# Patient Record
Sex: Male | Born: 1995 | Race: White | Hispanic: No | Marital: Single | Smoking: Never smoker
Health system: Southern US, Community
[De-identification: ages and names within clinical notes are randomized; demographics above are authoritative.]

---
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
## 2016-08-09 ENCOUNTER — Emergency Department: Payer: Medicaid Other

## 2016-08-09 ENCOUNTER — Emergency Department
Admission: EM | Admit: 2016-08-09 | Discharge: 2016-08-10 | Disposition: A | Discharge status: Home / Self Care | Payer: Medicaid Other | Attending: Emergency Medicine | Admitting: Emergency Medicine

## 2016-08-09 ENCOUNTER — Encounter: Payer: Self-pay | Admitting: Emergency Medicine

## 2016-08-09 DIAGNOSIS — J40 Bronchitis, not specified as acute or chronic: Secondary | ICD-10-CM

## 2016-08-09 DIAGNOSIS — R0602 Shortness of breath: Secondary | ICD-10-CM | POA: Diagnosis present

## 2016-08-09 LAB — COMPREHENSIVE METABOLIC PANEL
ALT: 31 U/L (ref 17–63)
AST: 23 U/L (ref 15–41)
Albumin: 4.6 g/dL (ref 3.5–5.0)
Alkaline Phosphatase: 86 U/L (ref 38–126)
Anion gap: 9 (ref 5–15)
BILIRUBIN TOTAL: 1 mg/dL (ref 0.3–1.2)
BUN: 17 mg/dL (ref 6–20)
CHLORIDE: 99 mmol/L — AB (ref 101–111)
CO2: 27 mmol/L (ref 22–32)
CREATININE: 1.19 mg/dL (ref 0.61–1.24)
Calcium: 9.6 mg/dL (ref 8.9–10.3)
Glucose, Bld: 92 mg/dL (ref 65–99)
Potassium: 4.2 mmol/L (ref 3.5–5.1)
Sodium: 135 mmol/L (ref 135–145)
TOTAL PROTEIN: 8.2 g/dL — AB (ref 6.5–8.1)

## 2016-08-09 LAB — TROPONIN I

## 2016-08-09 LAB — CBC WITH DIFFERENTIAL/PLATELET
BASOS ABS: 0.1 10*3/uL (ref 0–0.1)
Basophils Relative: 1 %
EOS PCT: 1 %
Eosinophils Absolute: 0.1 10*3/uL (ref 0–0.7)
HEMATOCRIT: 46.7 % (ref 40.0–52.0)
Hemoglobin: 16.1 g/dL (ref 13.0–18.0)
LYMPHS ABS: 2.8 10*3/uL (ref 1.0–3.6)
Lymphocytes Relative: 21 %
MCH: 29.6 pg (ref 26.0–34.0)
MCHC: 34.5 g/dL (ref 32.0–36.0)
MCV: 86 fL (ref 80.0–100.0)
MONO ABS: 1 10*3/uL (ref 0.2–1.0)
Monocytes Relative: 8 %
NEUTROS ABS: 9.4 10*3/uL — AB (ref 1.4–6.5)
NEUTROS PCT: 69 %
PLATELETS: 251 10*3/uL (ref 150–440)
RBC: 5.43 MIL/uL (ref 4.40–5.90)
RDW: 12.6 % (ref 11.5–14.5)
WBC: 13.5 10*3/uL — AB (ref 3.8–10.6)

## 2016-08-09 NOTE — ED Notes (Signed)
Pt ambulatory to desk with noted SOB, reports feels like he can't get more than half a breath in.  Pt's o2sat 100%, HR 110, pt seated in wheelchair, HR decreased to 90s.

## 2016-08-09 NOTE — ED Triage Notes (Signed)
Pt states that x 2 days he has been having some dizziness when taking a full breath. Pt reports that pain wraps around to the back when he breaths heavily. Pt also reports cold symptoms. Pt is in NAD at this time and has clear lung sounds upon auscultation in all fields.

## 2016-08-10 MED ORDER — AZITHROMYCIN 500 MG PO TABS
500.0000 mg | ORAL_TABLET | Freq: Once | ORAL | Status: AC
  Administered 2016-08-10: 500 mg via ORAL
  Filled 2016-08-10: qty 1

## 2016-08-10 MED ORDER — HYDROCOD POLST-CPM POLST ER 10-8 MG/5ML PO SUER: 5.0000 mL | Freq: Two times a day (BID) | ORAL | 0 refills | Status: AC | PRN

## 2016-08-10 MED ORDER — AZITHROMYCIN 250 MG PO TABS: ORAL_TABLET | ORAL | 0 refills | Status: AC

## 2016-08-10 NOTE — ED Notes (Signed)
Pt stating s/x's have been ongoing for the last 2-3 days. Pt also asking about a work note. Pt is A&O; skin is WPD, RR even, regular and unlabored, lung sounds clear in all fields.

## 2016-08-10 NOTE — ED Provider Notes (Signed)
[facility] Emergency Department Provider Note   First MD Initiated Contact with Patient 08/10/16 0117     (approximate)  I have reviewed the triage vital signs and the nursing notes.   HISTORY  Chief Complaint Shortness of Breath    HPI Mark Middleton is a 21 y.o. male presents to the emergency department with 2 day history of productive cough bilateral lower chest discomfort worse with deep inspiration. Patient denies any fever or febrile on presentation with temperature 98.7. Patient denies any known sick contact. 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  Past surgical history None  Prior to Admission medications   Medication Sig Start Date End Date Taking? Authorizing Provider  azithromycin (ZITHROMAX Z-PAK) 250 MG tablet Take 2 tablets (500 mg) on  Day 1,  followed by 1 tablet (250 mg) once daily on Days 2 through 5. 08/10/16 08/15/16  Darci Current, MD  chlorpheniramine-HYDROcodone [facility] PENNKINETIC ER) 10-8 MG/5ML SUER Take 5 mLs by mouth every 12 (twelve) hours as needed. 08/10/16   Darci Current, MD    Allergies No known allergies No family history on file.  Social History Social History  Substance Use Topics  . Smoking status: Never Smoker  . Smokeless tobacco: Never Used  . Alcohol use Yes     Comment: socially    Review of Systems Constitutional: No fever/chills Eyes: No visual changes. ENT: No sore throat. Cardiovascular: Denies chest pain. Respiratory: Denies shortness of breath.Positive for productive cough Gastrointestinal: No abdominal pain.  No nausea, no vomiting.  No diarrhea.  No constipation. Genitourinary: Negative for dysuria. Musculoskeletal: Negative for neck pain.  Negative for back pain. Integumentary: Negative for rash. Neurological: Negative for headaches, focal weakness or  numbness.   ____________________________________________   PHYSICAL EXAM:  VITAL SIGNS: ED Triage Vitals [08/09/16 2020]  Enc Vitals Group     BP 126/76     Pulse Rate 99     Resp 18     Temp 98.7 F (37.1 C)     Temp Source Oral     SpO2 98 %     Weight 108.9 kg (240 lb)     Height 1.753 m (5\' 9" )     Head Circumference      Peak Flow      Pain Score 3     Pain Loc      Pain Edu?      Excl. in GC?     Constitutional: Alert and oriented. Well appearing and in no acute distress. Eyes: Conjunctivae are normal.  Head: Atraumatic. Mouth/Throat: Mucous membranes are moist. Neck: No stridor.   Cardiovascular: Normal rate, regular rhythm. Good peripheral circulation. Grossly normal heart sounds. Respiratory: Normal respiratory effort.  No retractions. Lungs CTAB. Gastrointestinal: Soft and nontender. No distention.  Musculoskeletal: No lower extremity tenderness nor edema. No gross deformities of extremities. Neurologic:  Normal speech and language. No gross focal neurologic deficits are appreciated.  Skin:  Skin is warm, dry and intact. No rash noted. Psychiatric: Mood and affect are normal. Speech and behavior are normal.  ____________________________________________   LABS (all labs ordered are listed, but only abnormal results are displayed)  Labs Reviewed  CBC WITH DIFFERENTIAL/PLATELET - Abnormal; Notable for the following:       Result Value   WBC 13.5 (*)    Neutro Abs 9.4 (*)  All other components within normal limits  COMPREHENSIVE METABOLIC PANEL - Abnormal; Notable for the following:    Chloride 99 (*)    Total Protein 8.2 (*)    All other components within normal limits  TROPONIN I   ____________________________________________  EKG ED ECG REPORT I, [place] N BROWN, the attending physician, personally viewed and interpreted this ECG.   Date: 08/10/2016  EKG Time: 8:25 PM  Rate: 98  Rhythm: Normal sinus rhythm  Axis: Normal  Intervals:  Normal  ST&T Change: None  ____________________________________________  RADIOLOGY I, [place] N BROWN, personally viewed and evaluated these images (plain radiographs) as part of my medical decision making, as well as reviewing the written report by the radiologist.  Dg Chest 2 View  Result Date: 08/09/2016 CLINICAL DATA:  Lightheadedness and pleuritic chest pain. EXAM: CHEST  2 VIEW COMPARISON:  None. FINDINGS: The lungs are clear. The pulmonary vasculature is normal. Heart size is normal. Hilar and mediastinal contours are unremarkable. There is no pleural effusion. IMPRESSION: No active cardiopulmonary disease. Electronically Signed   By: Ellery Plunkaniel R Mitchell M.D.   On: 08/09/2016 21:03    Procedures   ____________________________________________   INITIAL IMPRESSION / ASSESSMENT AND PLAN / ED COURSE  Pertinent labs & imaging results that were available during my care of the patient were reviewed by me and considered in my medical decision making (see chart for details).        ____________________________________________  FINAL CLINICAL IMPRESSION(S) / ED DIAGNOSES  Final diagnoses:  Bronchitis     MEDICATIONS GIVEN DURING THIS VISIT:  Medications  azithromycin (ZITHROMAX) tablet 500 mg (not administered)     NEW OUTPATIENT MEDICATIONS STARTED DURING THIS VISIT:  New Prescriptions   AZITHROMYCIN (ZITHROMAX Z-PAK) 250 MG TABLET    Take 2 tablets (500 mg) on  Day 1,  followed by 1 tablet (250 mg) once daily on Days 2 through 5.   CHLORPHENIRAMINE-HYDROCODONE (TUSSIONEX PENNKINETIC ER) 10-8 MG/5ML SUER    Take 5 mLs by mouth every 12 (twelve) hours as needed.    Modified Medications   No medications on file    Discontinued Medications   No medications on file     Note:  This document was prepared using Dragon voice recognition software and may include unintentional dictation errors.    Darci CurrentBrown, [place] N, MD 08/10/16 [phone]

## 2018-12-27 IMAGING — CR DG CHEST 2V
2 series · 2 of 2 positions shown · non-contrast
Comparison: None.

CLINICAL DATA: Lightheadedness and pleuritic chest pain.

EXAM:
CHEST  2 VIEW

[chest pa]
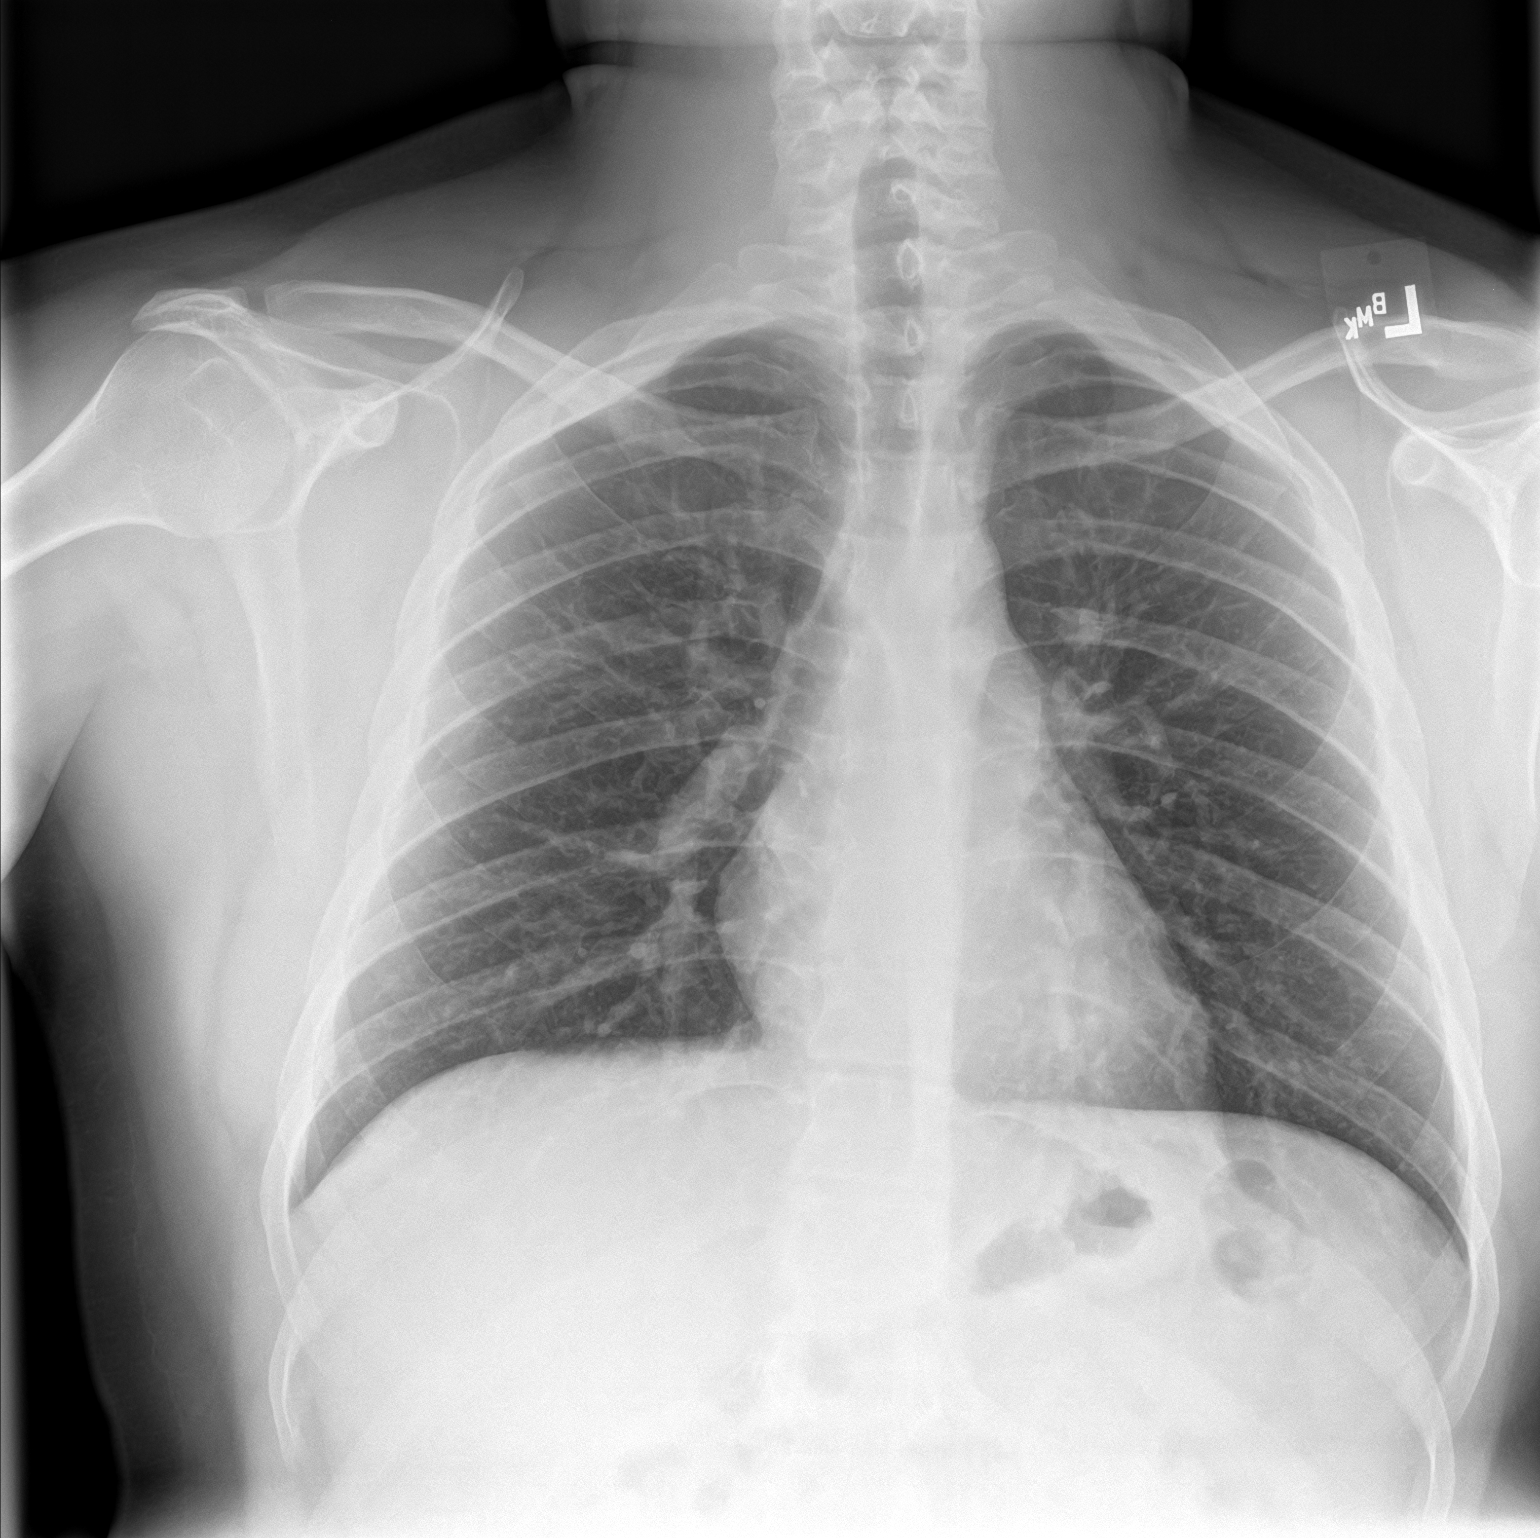

[chest lat]
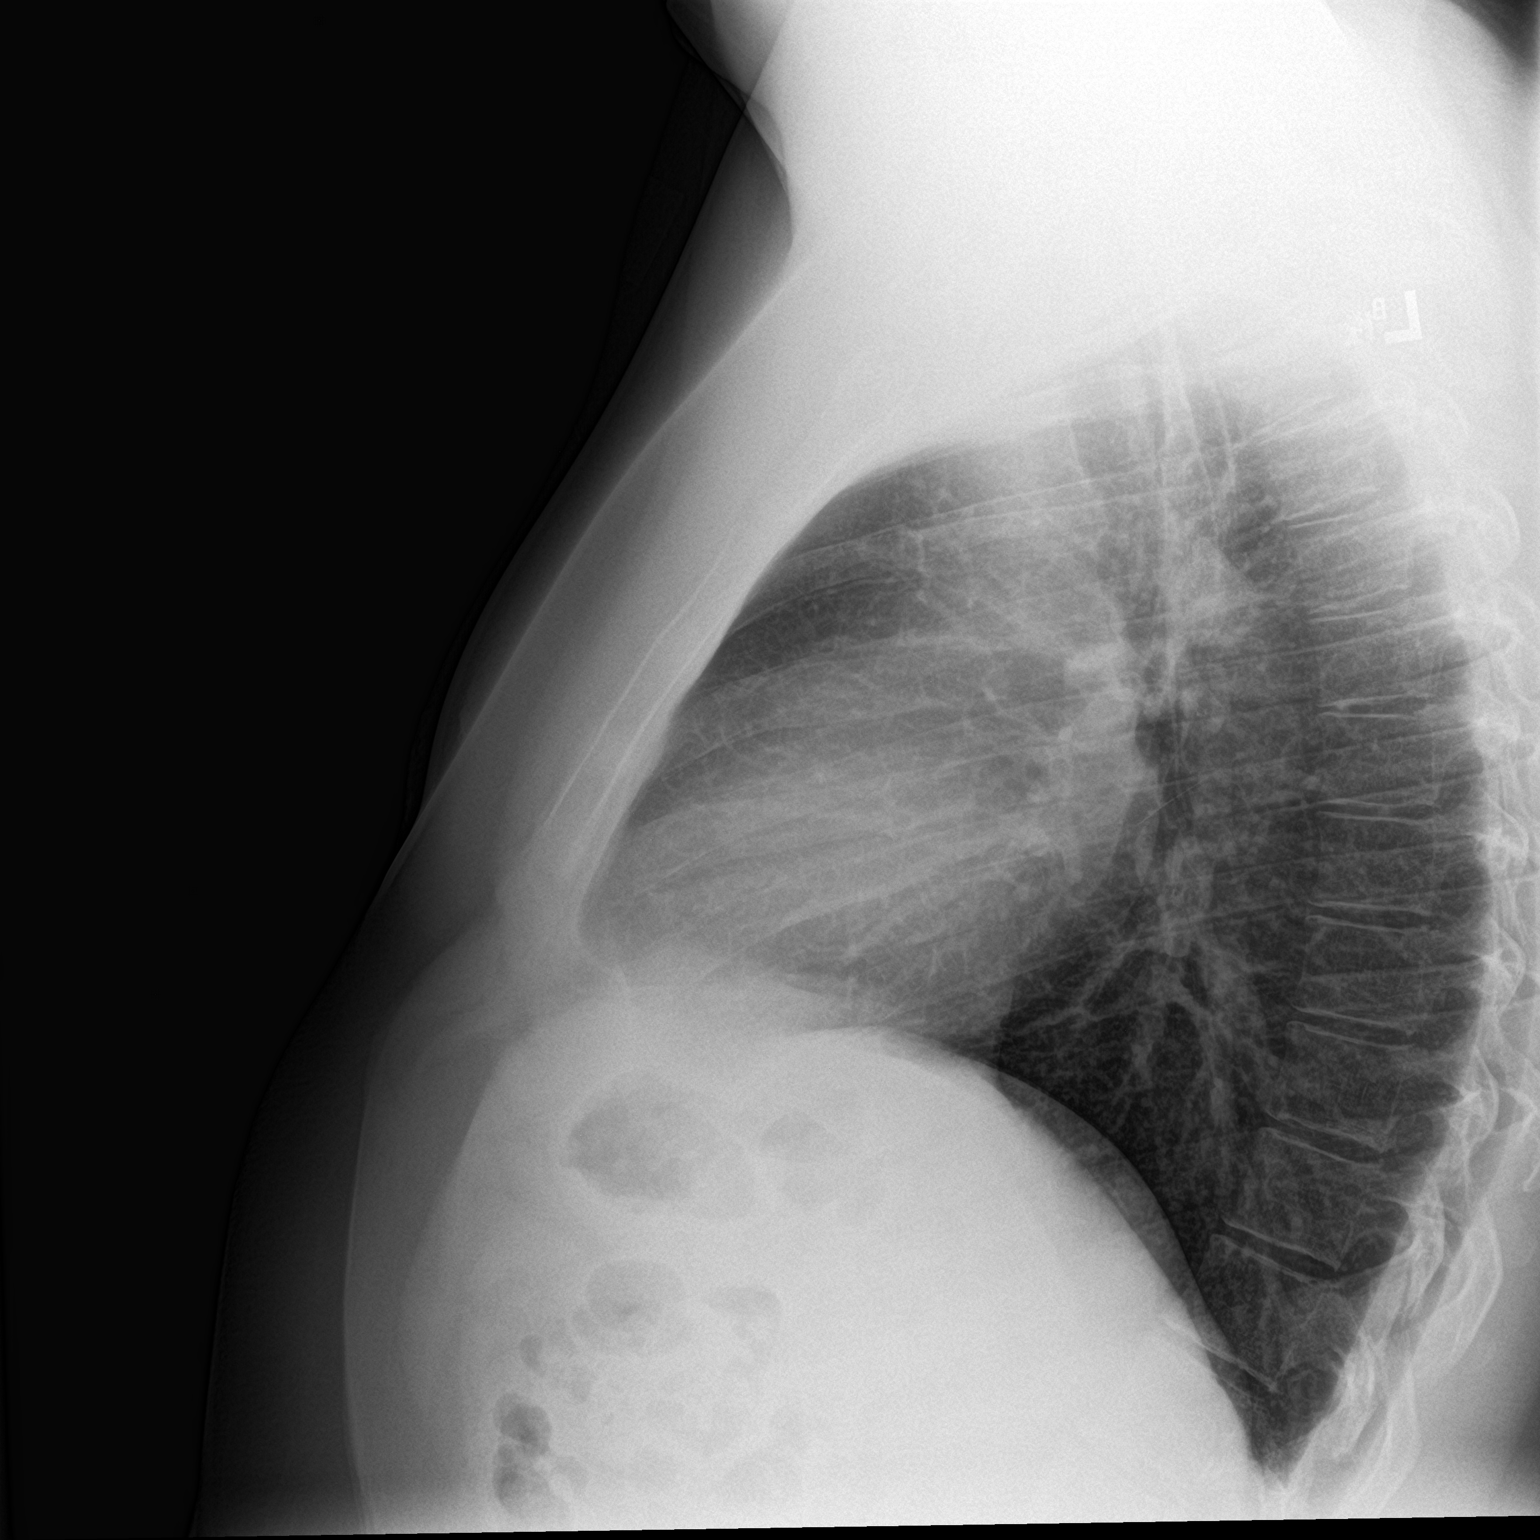

[2 of 2 positions shown; findings below may reference images not displayed]

FINDINGS: The lungs are clear. The pulmonary vasculature is normal. Heart size
is normal. Hilar and mediastinal contours are unremarkable. There is
no pleural effusion.
IMPRESSION: No active cardiopulmonary disease.
